# Patient Record
Sex: Female | Born: 1960 | Hispanic: No | Marital: Married | Smoking: Former smoker
Health system: Southern US, Community
[De-identification: ages and names within clinical notes are randomized; demographics above are authoritative.]

## PROBLEM LIST (undated history)

## (undated) DIAGNOSIS — Z973 Presence of spectacles and contact lenses: Secondary | ICD-10-CM

## (undated) DIAGNOSIS — F32A Depression, unspecified: Secondary | ICD-10-CM

## (undated) DIAGNOSIS — H9190 Unspecified hearing loss, unspecified ear: Secondary | ICD-10-CM

## (undated) DIAGNOSIS — E0500 Thyrotoxicosis with diffuse goiter without thyrotoxic crisis or storm: Secondary | ICD-10-CM

## (undated) DIAGNOSIS — F329 Major depressive disorder, single episode, unspecified: Secondary | ICD-10-CM

## (undated) HISTORY — PX: DIAGNOSTIC LAPAROSCOPY: SUR761

## (undated) HISTORY — PX: STAPEDES SURGERY: SHX789

## (undated) HISTORY — PX: CERVICAL FUSION: SHX112

## (undated) HISTORY — PX: COLONOSCOPY: SHX174

---

## 2000-03-12 ENCOUNTER — Other Ambulatory Visit: Admission: RE | Admit: 2000-03-12 | Discharge: 2000-03-12 | Payer: Self-pay | Admitting: Otolaryngology

## 2004-12-30 DIAGNOSIS — E0500 Thyrotoxicosis with diffuse goiter without thyrotoxic crisis or storm: Secondary | ICD-10-CM

## 2004-12-30 HISTORY — DX: Thyrotoxicosis with diffuse goiter without thyrotoxic crisis or storm: E05.00

## 2014-12-01 ENCOUNTER — Other Ambulatory Visit: Payer: Self-pay | Admitting: Orthopedic Surgery

## 2015-01-06 ENCOUNTER — Encounter (HOSPITAL_BASED_OUTPATIENT_CLINIC_OR_DEPARTMENT_OTHER): Payer: Self-pay | Admitting: *Deleted

## 2015-01-06 NOTE — Progress Notes (Signed)
No labs needed

## 2015-01-12 ENCOUNTER — Ambulatory Visit (HOSPITAL_BASED_OUTPATIENT_CLINIC_OR_DEPARTMENT_OTHER): Payer: 59 | Admitting: Certified Registered"

## 2015-01-12 ENCOUNTER — Ambulatory Visit (HOSPITAL_BASED_OUTPATIENT_CLINIC_OR_DEPARTMENT_OTHER)
Admission: RE | Admit: 2015-01-12 | Discharge: 2015-01-12 | Disposition: A | Discharge status: Home / Self Care | Payer: 59 | Source: Ambulatory Visit | Attending: Orthopedic Surgery | Admitting: Orthopedic Surgery

## 2015-01-12 ENCOUNTER — Encounter (HOSPITAL_BASED_OUTPATIENT_CLINIC_OR_DEPARTMENT_OTHER)
Admission: RE | Disposition: A | Discharge status: Home / Self Care | Payer: Self-pay | Source: Ambulatory Visit | Attending: Orthopedic Surgery

## 2015-01-12 ENCOUNTER — Encounter (HOSPITAL_BASED_OUTPATIENT_CLINIC_OR_DEPARTMENT_OTHER): Payer: Self-pay | Admitting: Certified Registered"

## 2015-01-12 DIAGNOSIS — F329 Major depressive disorder, single episode, unspecified: Secondary | ICD-10-CM | POA: Insufficient documentation

## 2015-01-12 DIAGNOSIS — G5602 Carpal tunnel syndrome, left upper limb: Secondary | ICD-10-CM | POA: Insufficient documentation

## 2015-01-12 DIAGNOSIS — M67432 Ganglion, left wrist: Secondary | ICD-10-CM | POA: Insufficient documentation

## 2015-01-12 DIAGNOSIS — Z87891 Personal history of nicotine dependence: Secondary | ICD-10-CM | POA: Diagnosis not present

## 2015-01-12 HISTORY — DX: Depression, unspecified: F32.A

## 2015-01-12 HISTORY — DX: Unspecified hearing loss, unspecified ear: H91.90

## 2015-01-12 HISTORY — PX: GANGLION CYST EXCISION: SHX1691

## 2015-01-12 HISTORY — DX: Major depressive disorder, single episode, unspecified: F32.9

## 2015-01-12 HISTORY — DX: Presence of spectacles and contact lenses: Z97.3

## 2015-01-12 HISTORY — PX: CARPAL TUNNEL RELEASE: SHX101

## 2015-01-12 LAB — POCT HEMOGLOBIN-HEMACUE: Hemoglobin: 14 g/dL (ref 12.0–15.0)

## 2015-01-12 SURGERY — CARPAL TUNNEL RELEASE
Anesthesia: General | Site: Wrist | Laterality: Left

## 2015-01-12 MED ORDER — FENTANYL CITRATE 0.05 MG/ML IJ SOLN: 50.0000 ug | INTRAMUSCULAR | Status: DC | PRN

## 2015-01-12 MED ORDER — FENTANYL CITRATE 0.05 MG/ML IJ SOLN
INTRAMUSCULAR | Status: AC
  Filled 2015-01-12: qty 6

## 2015-01-12 MED ORDER — OXYCODONE-ACETAMINOPHEN 5-325 MG PO TABS: ORAL_TABLET | ORAL | Status: DC

## 2015-01-12 MED ORDER — LIDOCAINE HCL (PF) 1 % IJ SOLN
INTRAMUSCULAR | Status: AC
  Filled 2015-01-12: qty 90

## 2015-01-12 MED ORDER — MIDAZOLAM HCL 2 MG/2ML IJ SOLN
INTRAMUSCULAR | Status: AC
  Filled 2015-01-12: qty 2

## 2015-01-12 MED ORDER — LACTATED RINGERS IV SOLN
INTRAVENOUS | Status: DC
  Administered 2015-01-12: 08:00:00 via INTRAVENOUS

## 2015-01-12 MED ORDER — MIDAZOLAM HCL 5 MG/5ML IJ SOLN
INTRAMUSCULAR | Status: DC | PRN
  Administered 2015-01-12: 2 mg via INTRAVENOUS

## 2015-01-12 MED ORDER — CEFAZOLIN SODIUM-DEXTROSE 2-3 GM-% IV SOLR
INTRAVENOUS | Status: AC
  Filled 2015-01-12: qty 50

## 2015-01-12 MED ORDER — MIDAZOLAM HCL 2 MG/2ML IJ SOLN: 1.0000 mg | INTRAMUSCULAR | Status: DC | PRN

## 2015-01-12 MED ORDER — OXYCODONE HCL 5 MG/5ML PO SOLN: 5.0000 mg | Freq: Once | ORAL | Status: DC | PRN

## 2015-01-12 MED ORDER — OXYCODONE HCL 5 MG PO TABS: 5.0000 mg | ORAL_TABLET | Freq: Once | ORAL | Status: DC | PRN

## 2015-01-12 MED ORDER — LIDOCAINE HCL (CARDIAC) 20 MG/ML IV SOLN
INTRAVENOUS | Status: DC | PRN
  Administered 2015-01-12: 80 mg via INTRAVENOUS

## 2015-01-12 MED ORDER — HYDROMORPHONE HCL 1 MG/ML IJ SOLN
INTRAMUSCULAR | Status: AC
  Filled 2015-01-12: qty 1

## 2015-01-12 MED ORDER — CHLORHEXIDINE GLUCONATE 4 % EX LIQD: 60.0000 mL | Freq: Once | CUTANEOUS | Status: DC

## 2015-01-12 MED ORDER — FENTANYL CITRATE 0.05 MG/ML IJ SOLN
INTRAMUSCULAR | Status: DC | PRN
  Administered 2015-01-12: 100 ug via INTRAVENOUS
  Administered 2015-01-12: 50 ug via INTRAVENOUS

## 2015-01-12 MED ORDER — ONDANSETRON HCL 4 MG/2ML IJ SOLN: 4.0000 mg | Freq: Once | INTRAMUSCULAR | Status: DC | PRN

## 2015-01-12 MED ORDER — DEXAMETHASONE SODIUM PHOSPHATE 10 MG/ML IJ SOLN
INTRAMUSCULAR | Status: DC | PRN
  Administered 2015-01-12: 10 mg via INTRAVENOUS

## 2015-01-12 MED ORDER — CEFAZOLIN SODIUM-DEXTROSE 2-3 GM-% IV SOLR
2.0000 g | INTRAVENOUS | Status: AC
  Administered 2015-01-12: 2 g via INTRAVENOUS

## 2015-01-12 MED ORDER — PROPOFOL 10 MG/ML IV BOLUS
INTRAVENOUS | Status: DC | PRN
  Administered 2015-01-12: 200 mg via INTRAVENOUS

## 2015-01-12 MED ORDER — ONDANSETRON HCL 4 MG/2ML IJ SOLN
INTRAMUSCULAR | Status: DC | PRN
  Administered 2015-01-12: 4 mg via INTRAVENOUS

## 2015-01-12 MED ORDER — 0.9 % SODIUM CHLORIDE (POUR BTL) OPTIME
TOPICAL | Status: DC | PRN
  Administered 2015-01-12: 100 mL

## 2015-01-12 MED ORDER — BUPIVACAINE HCL (PF) 0.25 % IJ SOLN
INTRAMUSCULAR | Status: AC
  Filled 2015-01-12: qty 150

## 2015-01-12 MED ORDER — BUPIVACAINE HCL (PF) 0.25 % IJ SOLN
INTRAMUSCULAR | Status: DC | PRN
  Administered 2015-01-12: 10 mL

## 2015-01-12 MED ORDER — HYDROMORPHONE HCL 1 MG/ML IJ SOLN
0.2500 mg | INTRAMUSCULAR | Status: DC | PRN
  Administered 2015-01-12 (×4): 0.5 mg via INTRAVENOUS

## 2015-01-12 SURGICAL SUPPLY — 48 items
APL SKNCLS STERI-STRIP NONHPOA (GAUZE/BANDAGES/DRESSINGS)
BANDAGE ELASTIC 3 VELCRO ST LF (GAUZE/BANDAGES/DRESSINGS) ×3 IMPLANT
BENZOIN TINCTURE PRP APPL 2/3 (GAUZE/BANDAGES/DRESSINGS) IMPLANT
BLADE MINI RND TIP GREEN BEAV (BLADE) IMPLANT
BLADE SURG 15 STRL LF DISP TIS (BLADE) ×2 IMPLANT
BLADE SURG 15 STRL SS (BLADE) ×6
BNDG CMPR 9X4 STRL LF SNTH (GAUZE/BANDAGES/DRESSINGS) ×1
BNDG CMPR MD 5X2 ELC HKLP STRL (GAUZE/BANDAGES/DRESSINGS)
BNDG ELASTIC 2 VLCR STRL LF (GAUZE/BANDAGES/DRESSINGS) IMPLANT
BNDG ESMARK 4X9 LF (GAUZE/BANDAGES/DRESSINGS) ×2 IMPLANT
BNDG GAUZE ELAST 4 BULKY (GAUZE/BANDAGES/DRESSINGS) ×3 IMPLANT
CHLORAPREP W/TINT 26ML (MISCELLANEOUS) ×3 IMPLANT
CLOSURE WOUND 1/2 X4 (GAUZE/BANDAGES/DRESSINGS)
CORDS BIPOLAR (ELECTRODE) ×3 IMPLANT
COVER BACK TABLE 60X90IN (DRAPES) ×3 IMPLANT
COVER MAYO STAND STRL (DRAPES) ×3 IMPLANT
CUFF TOURNIQUET SINGLE 18IN (TOURNIQUET CUFF) ×3 IMPLANT
DRAPE EXTREMITY T 121X128X90 (DRAPE) ×3 IMPLANT
DRAPE SURG 17X23 STRL (DRAPES) ×3 IMPLANT
DRSG PAD ABDOMINAL 8X10 ST (GAUZE/BANDAGES/DRESSINGS) ×3 IMPLANT
GAUZE SPONGE 4X4 12PLY STRL (GAUZE/BANDAGES/DRESSINGS) ×3 IMPLANT
GAUZE XEROFORM 1X8 LF (GAUZE/BANDAGES/DRESSINGS) ×3 IMPLANT
GLOVE BIO SURGEON STRL SZ7.5 (GLOVE) ×3 IMPLANT
GLOVE BIOGEL PI IND STRL 8 (GLOVE) ×1 IMPLANT
GLOVE BIOGEL PI INDICATOR 8 (GLOVE) ×2
GOWN STRL REUS W/ TWL LRG LVL3 (GOWN DISPOSABLE) ×1 IMPLANT
GOWN STRL REUS W/TWL LRG LVL3 (GOWN DISPOSABLE) ×3
GOWN STRL REUS W/TWL XL LVL3 (GOWN DISPOSABLE) ×3 IMPLANT
NDL HYPO 25X1 1.5 SAFETY (NEEDLE) IMPLANT
NEEDLE HYPO 25X1 1.5 SAFETY (NEEDLE) ×3 IMPLANT
NS IRRIG 1000ML POUR BTL (IV SOLUTION) ×3 IMPLANT
PACK BASIN DAY SURGERY FS (CUSTOM PROCEDURE TRAY) ×3 IMPLANT
PAD CAST 3X4 CTTN HI CHSV (CAST SUPPLIES) IMPLANT
PADDING CAST ABS 4INX4YD NS (CAST SUPPLIES) ×2
PADDING CAST ABS COTTON 4X4 ST (CAST SUPPLIES) ×1 IMPLANT
PADDING CAST COTTON 3X4 STRL (CAST SUPPLIES)
SPLINT PLASTER CAST XFAST 3X15 (CAST SUPPLIES) IMPLANT
SPLINT PLASTER XTRA FASTSET 3X (CAST SUPPLIES)
STOCKINETTE 4X48 STRL (DRAPES) ×3 IMPLANT
STRIP CLOSURE SKIN 1/2X4 (GAUZE/BANDAGES/DRESSINGS) IMPLANT
SUT ETHILON 4 0 PS 2 18 (SUTURE) ×3 IMPLANT
SUT MNCRL AB 4-0 PS2 18 (SUTURE) IMPLANT
SUT MON AB 5-0 PS2 18 (SUTURE) IMPLANT
SUT VIC AB 4-0 P2 18 (SUTURE) ×2 IMPLANT
SYR BULB 3OZ (MISCELLANEOUS) ×3 IMPLANT
SYR CONTROL 10ML LL (SYRINGE) ×2 IMPLANT
TOWEL OR 17X24 6PK STRL BLUE (TOWEL DISPOSABLE) ×4 IMPLANT
UNDERPAD 30X30 INCONTINENT (UNDERPADS AND DIAPERS) ×1 IMPLANT

## 2015-01-12 NOTE — Anesthesia Postprocedure Evaluation (Signed)
  Anesthesia Post-op Note  Patient: [name]  Procedure(s) Performed: Procedure(s): LEFT CARPAL TUNNEL RELEASE AND  (Left) VOLAR GANGLION EXCISION (Left)  Patient Location: PACU  Anesthesia Type: General   Level of Consciousness: awake, alert  and oriented  Airway and Oxygen Therapy: Patient Spontanous Breathing  Post-op Pain: mild  Post-op Assessment: Post-op Vital signs reviewed  Post-op Vital Signs: Reviewed  Last Vitals:  Filed Vitals:   01/12/15 1105  BP: 99/45  Pulse: 72  Temp: 36.4 C  Resp: 16    Complications: No apparent anesthesia complications

## 2015-01-12 NOTE — Transfer of Care (Signed)
Immediate Anesthesia Transfer of Care Note  Patient: Denise Glenn  Procedure(s) Performed: Procedure(s): LEFT CARPAL TUNNEL RELEASE AND  (Left) VOLAR GANGLION EXCISION (Left)  Patient Location: PACU  Anesthesia Type:General  Level of Consciousness: awake, sedated and responds to stimulation  Airway & Oxygen Therapy: Patient Spontanous Breathing and Patient connected to face mask oxygen  Post-op Assessment: Report given to PACU RN, Post -op Vital signs reviewed and stable and Patient moving all extremities  Post vital signs: Reviewed and stable  Complications: No apparent anesthesia complications

## 2015-01-12 NOTE — Op Note (Signed)
NAME:  Denise Glenn, DEMOND           ACCOUNT NO.:  [id number]  MEDICAL RECORD NO.:  [id number]  LOCATION:                                 FACILITY:  PHYSICIAN:  Betha Loa, MD             DATE OF BIRTH:  DATE OF PROCEDURE:  01/12/2015 DATE OF DISCHARGE:                              OPERATIVE REPORT   PREOPERATIVE DIAGNOSIS:  Left carpal tunnel syndrome and volar ganglion cyst.  POSTOPERATIVE DIAGNOSES:  Left carpal tunnel syndrome and volar ganglion cyst.  PROCEDURE:   1. Left carpal tunnel release 2. Excision of left wrist volar ganglion cyst through separate incision.  SURGEON:  Betha Loa, MD  ASSISTANTS:  None.  ANESTHESIA:  General.  IV FLUIDS:  Per anesthesia flow sheet.  ESTIMATED BLOOD LOSS:  Minimal.  COMPLICATIONS:  None.  SPECIMENS:  Left wrist mass to Pathology.  TOURNIQUET TIME:  35 minutes.  DISPOSITION:  Stable to PACU.  INDICATIONS:  Denise Glenn is a 54 year old right-hand-dominant female, who has had a mass on her left wrist for number of months.  This is bothersome to her.  She also has carpal tunnel syndrome.  She has positive nerve conduction studies.  She wished to have the mass excised and carpal tunnel release.  Risks benefits and alternatives of surgery were discussed including the risk of blood loss, infection, damage to nerves, vessels, tendons, ligaments, bone; failure of surgery; need for additional surgery, complications with wound healing, continued pain, recurrence of carpal tunnel, and recurrence of mass as well as damage to motor branch.  She voiced understanding of these risks and elected to proceed.  OPERATIVE COURSE:  After being identified preoperatively by myself, the patient and I agreed upon procedure and site of procedure.  Surgical site was marked.  The risks, benefits, and alternatives of surgery were reviewed and she wished to proceed.  Surgical consent had been signed. She was given IV Ancef as preoperative  antibiotic prophylaxis.  She was transported to the operating  room and placed on the operating room table in supine position with the left upper extremity on arm board. General anesthesia was induced by anesthesiologist.  Left upper extremity was prepped and draped in normal sterile orthopedic fashion. Surgical pause was performed between surgeons, anesthesia, and operating room staff, and all were in agreement as to the patient, procedure, and site of procedure.  Tourniquet at the proximal aspect of the extremity was inflated to 250 mmHg after exsanguination of the limb with an Esmarch bandage.  Incision was made over the transverse carpal ligament and carried into subcutaneous tissues by spreading technique.  Bipolar electrocautery was used to obtain hemostasis in the subcutaneous tissues.  The palmar fascia was sharply incised.  The transverse carpal ligament was identified.  It was incised distally first.  Care was taken to ensure complete decompression distally.  It was then incised proximally.  Scissors were used to split the distal aspect of the Volar antebrachial fascia.  A finger was placed into the wound to ensure complete decompression which was the case.  The nerve was inspected.  It was adherent to the radial leaflet.  The motor branch was identified and was  intact.  The wound was copiously irrigated with sterile saline.  It was closed with 4-0 nylon in a horizontal mattress fashion.  An incision was then made over the mass at the radial side of the wrist.  This was carried into subcutaneous tissues by spreading technique.  Bipolar electrocautery was used to obtain hemostasis in the subcutaneous tissues.  The mass was noted.  It was directly under the radial artery. It was adherent to the under surface of the radial artery.  Great care was taken to free this up.  The mass was freed with soft tissue attachments and the stalk was found coursing down to the joint.  The mass was  removed and sent to Pathology for examination.  A stitch was placed in the rent in the capsule using 4-0 Vicryl suture.  The wound was then copiously irrigated with sterile saline and the skin was closed with 4-0 nylon in a horizontal mattress fashion.  The wounds were all injected with 10 mL of 0.25% plain Marcaine to aid in postoperative analgesia.  The wounds were dressed with sterile Xeroform, 4x4s, and ABD and wrapped with Kerlix and Ace bandage.  Tourniquet was deflated at 35 minutes.  Fingertips were pink with brisk capillary refill after deflation of the tourniquet.  The operative drapes were broken down. The patient was awoken from anesthesia safely.  She was transferred back to stretcher and taken to PACU in stable condition.  I will see her back in the office in 1 week for postoperative followup.  I will give her Percocet 5/325, 1-2 p.o. q.6 hours p.r.n. pain, dispensed #40.     Betha LoaKevin Xylina Rhoads, MD     KK/MEDQ  D:  01/12/2015  T:  01/12/2015  Job:  [id number]

## 2015-01-12 NOTE — Anesthesia Preprocedure Evaluation (Signed)
Anesthesia Evaluation  Patient identified by MRN, date of birth, ID band Patient awake    Reviewed: Allergy & Precautions, NPO status , Patient's Chart, lab work & pertinent test results  Airway Mallampati: I  TM Distance: >3 FB Neck ROM: Full    Dental  (+) Teeth Intact, Dental Advisory Given   Pulmonary former smoker,  breath sounds clear to auscultation        Cardiovascular Rhythm:Regular Rate:Normal     Neuro/Psych    GI/Hepatic   Endo/Other    Renal/GU      Musculoskeletal   Abdominal   Peds  Hematology   Anesthesia Other Findings   Reproductive/Obstetrics                            Anesthesia Physical Anesthesia Plan  ASA: II  Anesthesia Plan: General   Post-op Pain Management:    Induction: Intravenous  Airway Management Planned: LMA  Additional Equipment:   Intra-op Plan:   Post-operative Plan: Extubation in OR  Informed Consent: I have reviewed the patients History and Physical, chart, labs and discussed the procedure including the risks, benefits and alternatives for the proposed anesthesia with the patient or authorized representative who has indicated his/her understanding and acceptance.   Dental advisory given  Plan Discussed with: CRNA, Anesthesiologist and Surgeon  Anesthesia Plan Comments:         Anesthesia Quick Evaluation  

## 2015-01-12 NOTE — Anesthesia Procedure Notes (Signed)
Procedure Name: LMA Insertion Date/Time: 01/12/2015 8:41 AM Performed by: Curly ShoresRAFT, Samika Vetsch W Pre-anesthesia Checklist: Patient identified, Emergency Drugs available, Suction available and Patient being monitored Patient Re-evaluated:Patient Re-evaluated prior to inductionOxygen Delivery Method: Circle System Utilized Preoxygenation: Pre-oxygenation with 100% oxygen Intubation Type: IV induction Ventilation: Mask ventilation without difficulty LMA: LMA inserted LMA Size: 4.0 Number of attempts: 1 Airway Equipment and Method: Bite block Placement Confirmation: positive ETCO2 and breath sounds checked- equal and bilateral Tube secured with: Tape Dental Injury: Teeth and Oropharynx as per pre-operative assessment

## 2015-01-12 NOTE — Op Note (Signed)
[id number] 

## 2015-01-12 NOTE — Brief Op Note (Signed)
01/12/2015  9:29 AM  PATIENT:  Luisa HartVickie Sahota  54 y.o. female  PRE-OPERATIVE DIAGNOSIS:  LEFT CARPAL TUNNEL SYNDROME VOLAR GANGLION CYST  POST-OPERATIVE DIAGNOSIS:  LEFT CARPAL TUNNEL SYNDROME VOLAR GANGLION  PROCEDURE:  Procedure(s): LEFT CARPAL TUNNEL RELEASE AND  (Left) VOLAR GANGLION EXCISION (Left)  SURGEON:  Surgeon(s) and Role:    * Betha LoaKevin Jony Ladnier, MD - Primary  PHYSICIAN ASSISTANT:   ASSISTANTS: none   ANESTHESIA:   general  EBL:  Total I/O In: 700 [I.V.:700] Out: -   BLOOD ADMINISTERED:none  DRAINS: none   LOCAL MEDICATIONS USED:  MARCAINE     SPECIMEN:  Source of Specimen:  left wrist  DISPOSITION OF SPECIMEN:  PATHOLOGY  COUNTS:  YES  TOURNIQUET:   Total Tourniquet Time Documented: Upper Arm (Left) - 35 minutes Total: Upper Arm (Left) - 35 minutes   DICTATION: .Other Dictation: Dictation Number [id number]  PLAN OF CARE: Discharge to home after PACU  PATIENT DISPOSITION:  PACU - hemodynamically stable.

## 2015-01-12 NOTE — Discharge Instructions (Addendum)

## 2015-01-12 NOTE — H&P (Signed)
  Denise HartVickie Sells is an 54 y.o. female.   Chief Complaint: left carpal tunnel and volar ganglion cyst HPI: 54 yo rhd female with cyst at volar left wrist and numbness and tingling in fingertips.  Nocturnal symptoms that wake her three times per week.  Positive nerve conduction studies.  She wishes to have a left carpal tunnel release and volar ganglion excision.    Past Medical History  Diagnosis Date  . Depression   . Wears glasses   . Hearing deficit     Past Surgical History  Procedure Laterality Date  . Stapedes surgery      both ears  . Cervical fusion  [id number]    x2  . Colonoscopy    . Diagnostic laparoscopy      ectopic preg    History reviewed. No pertinent family history. Social History:  reports that she quit smoking about 6 years ago. She does not have any smokeless tobacco history on file. She reports that she does not drink alcohol or use illicit drugs.  Allergies:  Allergies  Allergen Reactions  . Sulfa Antibiotics Rash    Medications Prior to Admission  Medication Sig Dispense Refill  . buPROPion (WELLBUTRIN XL) 300 MG 24 hr tablet Take 300 mg by mouth daily.    . cholecalciferol (VITAMIN D) 1000 UNITS tablet Take 1,000 Units by mouth daily.    . fexofenadine (ALLEGRA) 180 MG tablet Take 180 mg by mouth daily.    Marland Kitchen. pyridoxine (B-6) 100 MG tablet Take 100 mg by mouth daily.    . vitamin A 1610910000 UNIT capsule Take 10,000 Units by mouth daily.    . vitamin C (ASCORBIC ACID) 500 MG tablet Take 500 mg by mouth daily.      Results for orders placed or performed during the hospital encounter of 01/12/15 (from the past 48 hour(s))  Hemoglobin-hemacue, POC     Status: None   Collection Time: 01/12/15  7:57 AM  Result Value Ref Range   Hemoglobin 14.0 12.0 - 15.0 g/dL    No results found.   A comprehensive review of systems was negative except for: Eyes: positive for contacts/glasses  Blood pressure 111/58, pulse 77, temperature 97.9 F (36.6 C),  temperature source Oral, resp. rate 16, height 5\' 6"  (1.676 m), weight 65.942 kg (145 lb 6 oz), SpO2 100 %.  General appearance: alert, cooperative and appears stated age Head: Normocephalic, without obvious abnormality, atraumatic Neck: supple, symmetrical, trachea midline Resp: clear to auscultation bilaterally Cardio: regular rate and rhythm GI: non tender Extremities: intact sensation and capillary refill all digits.  +epl/fpl/io.  no wounds. Pulses: 2+ and symmetric Skin: Skin color, texture, turgor normal. No rashes or lesions Neurologic: Grossly normal Incision/Wound: none  Assessment/Plan Left carpal tunnel syndrome and volar ganglion cyst.  Non operative and operative treatment options were discussed with the patient and patient wishes to proceed with operative treatment. Risks, benefits, and alternatives of surgery were discussed and the patient agrees with the plan of care.   Layla Gramm R 01/12/2015, 8:27 AM

## 2015-01-13 ENCOUNTER — Encounter (HOSPITAL_BASED_OUTPATIENT_CLINIC_OR_DEPARTMENT_OTHER): Payer: Self-pay | Admitting: Orthopedic Surgery

## 2015-01-31 ENCOUNTER — Other Ambulatory Visit: Payer: Self-pay | Admitting: Orthopedic Surgery

## 2015-02-22 ENCOUNTER — Encounter (HOSPITAL_BASED_OUTPATIENT_CLINIC_OR_DEPARTMENT_OTHER): Payer: Self-pay | Admitting: *Deleted

## 2015-02-23 ENCOUNTER — Ambulatory Visit (HOSPITAL_BASED_OUTPATIENT_CLINIC_OR_DEPARTMENT_OTHER): Payer: 59 | Admitting: Anesthesiology

## 2015-02-23 ENCOUNTER — Ambulatory Visit (HOSPITAL_BASED_OUTPATIENT_CLINIC_OR_DEPARTMENT_OTHER)
Admission: RE | Admit: 2015-02-23 | Discharge: 2015-02-23 | Disposition: A | Discharge status: Home / Self Care | Payer: 59 | Source: Ambulatory Visit | Attending: Orthopedic Surgery | Admitting: Orthopedic Surgery

## 2015-02-23 ENCOUNTER — Encounter (HOSPITAL_BASED_OUTPATIENT_CLINIC_OR_DEPARTMENT_OTHER): Payer: Self-pay | Admitting: *Deleted

## 2015-02-23 ENCOUNTER — Encounter (HOSPITAL_BASED_OUTPATIENT_CLINIC_OR_DEPARTMENT_OTHER)
Admission: RE | Disposition: A | Discharge status: Home / Self Care | Payer: Self-pay | Source: Ambulatory Visit | Attending: Orthopedic Surgery

## 2015-02-23 DIAGNOSIS — F329 Major depressive disorder, single episode, unspecified: Secondary | ICD-10-CM | POA: Insufficient documentation

## 2015-02-23 DIAGNOSIS — H9190 Unspecified hearing loss, unspecified ear: Secondary | ICD-10-CM | POA: Insufficient documentation

## 2015-02-23 DIAGNOSIS — Z79899 Other long term (current) drug therapy: Secondary | ICD-10-CM | POA: Insufficient documentation

## 2015-02-23 DIAGNOSIS — G5601 Carpal tunnel syndrome, right upper limb: Secondary | ICD-10-CM | POA: Insufficient documentation

## 2015-02-23 DIAGNOSIS — Z87891 Personal history of nicotine dependence: Secondary | ICD-10-CM | POA: Diagnosis not present

## 2015-02-23 DIAGNOSIS — R200 Anesthesia of skin: Secondary | ICD-10-CM | POA: Diagnosis present

## 2015-02-23 HISTORY — PX: CARPAL TUNNEL RELEASE: SHX101

## 2015-02-23 SURGERY — CARPAL TUNNEL RELEASE
Anesthesia: General | Site: Wrist | Laterality: Left

## 2015-02-23 MED ORDER — FENTANYL CITRATE 0.05 MG/ML IJ SOLN: 50.0000 ug | INTRAMUSCULAR | Status: DC | PRN

## 2015-02-23 MED ORDER — CEFAZOLIN SODIUM-DEXTROSE 2-3 GM-% IV SOLR
INTRAVENOUS | Status: AC
  Filled 2015-02-23: qty 50

## 2015-02-23 MED ORDER — FENTANYL CITRATE 0.05 MG/ML IJ SOLN
INTRAMUSCULAR | Status: AC
  Filled 2015-02-23: qty 4

## 2015-02-23 MED ORDER — PROMETHAZINE HCL 25 MG/ML IJ SOLN: 6.2500 mg | INTRAMUSCULAR | Status: DC | PRN

## 2015-02-23 MED ORDER — BUPIVACAINE HCL (PF) 0.25 % IJ SOLN
INTRAMUSCULAR | Status: AC
  Filled 2015-02-23: qty 120

## 2015-02-23 MED ORDER — CHLORHEXIDINE GLUCONATE 4 % EX LIQD: 60.0000 mL | Freq: Once | CUTANEOUS | Status: DC

## 2015-02-23 MED ORDER — LIDOCAINE HCL (CARDIAC) 20 MG/ML IV SOLN
INTRAVENOUS | Status: DC | PRN
  Administered 2015-02-23: 60 mg via INTRAVENOUS

## 2015-02-23 MED ORDER — MIDAZOLAM HCL 2 MG/2ML IJ SOLN
INTRAMUSCULAR | Status: AC
  Filled 2015-02-23: qty 2

## 2015-02-23 MED ORDER — BUPIVACAINE HCL (PF) 0.25 % IJ SOLN
INTRAMUSCULAR | Status: DC | PRN
  Administered 2015-02-23: 10 mL

## 2015-02-23 MED ORDER — CEFAZOLIN SODIUM-DEXTROSE 2-3 GM-% IV SOLR
2.0000 g | INTRAVENOUS | Status: AC
  Administered 2015-02-23: 2 g via INTRAVENOUS

## 2015-02-23 MED ORDER — ONDANSETRON HCL 4 MG/2ML IJ SOLN
INTRAMUSCULAR | Status: DC | PRN
  Administered 2015-02-23: 4 mg via INTRAVENOUS

## 2015-02-23 MED ORDER — OXYCODONE-ACETAMINOPHEN 5-325 MG PO TABS
ORAL_TABLET | ORAL | Status: AC
Start: 2015-02-23 — End: ?

## 2015-02-23 MED ORDER — MIDAZOLAM HCL 2 MG/2ML IJ SOLN: 1.0000 mg | INTRAMUSCULAR | Status: DC | PRN

## 2015-02-23 MED ORDER — PROPOFOL 10 MG/ML IV BOLUS
INTRAVENOUS | Status: DC | PRN
  Administered 2015-02-23: 200 mg via INTRAVENOUS

## 2015-02-23 MED ORDER — FENTANYL CITRATE 0.05 MG/ML IJ SOLN
INTRAMUSCULAR | Status: DC | PRN
  Administered 2015-02-23: 100 ug via INTRAVENOUS

## 2015-02-23 MED ORDER — KETOROLAC TROMETHAMINE 30 MG/ML IJ SOLN
INTRAMUSCULAR | Status: DC | PRN
  Administered 2015-02-23: 30 mg via INTRAVENOUS

## 2015-02-23 MED ORDER — LACTATED RINGERS IV SOLN
INTRAVENOUS | Status: DC
  Administered 2015-02-23: 07:00:00 via INTRAVENOUS

## 2015-02-23 MED ORDER — DEXAMETHASONE SODIUM PHOSPHATE 4 MG/ML IJ SOLN
INTRAMUSCULAR | Status: DC | PRN
  Administered 2015-02-23: 10 mg via INTRAVENOUS

## 2015-02-23 MED ORDER — MIDAZOLAM HCL 5 MG/5ML IJ SOLN
INTRAMUSCULAR | Status: DC | PRN
  Administered 2015-02-23: 2 mg via INTRAVENOUS

## 2015-02-23 MED ORDER — FENTANYL CITRATE 0.05 MG/ML IJ SOLN: 25.0000 ug | INTRAMUSCULAR | Status: DC | PRN

## 2015-02-23 SURGICAL SUPPLY — 42 items
BANDAGE ELASTIC 3 VELCRO ST LF (GAUZE/BANDAGES/DRESSINGS) ×3 IMPLANT
BLADE MINI RND TIP GREEN BEAV (BLADE) IMPLANT
BLADE SURG 15 STRL LF DISP TIS (BLADE) ×2 IMPLANT
BLADE SURG 15 STRL SS (BLADE) ×6
BNDG CMPR 9X4 STRL LF SNTH (GAUZE/BANDAGES/DRESSINGS) ×1
BNDG ESMARK 4X9 LF (GAUZE/BANDAGES/DRESSINGS) ×2 IMPLANT
BNDG GAUZE ELAST 4 BULKY (GAUZE/BANDAGES/DRESSINGS) ×3 IMPLANT
CHLORAPREP W/TINT 26ML (MISCELLANEOUS) ×3 IMPLANT
CORDS BIPOLAR (ELECTRODE) ×3 IMPLANT
COVER BACK TABLE 60X90IN (DRAPES) ×3 IMPLANT
COVER MAYO STAND STRL (DRAPES) ×3 IMPLANT
CUFF TOURNIQUET SINGLE 18IN (TOURNIQUET CUFF) ×3 IMPLANT
DRAPE EXTREMITY T 121X128X90 (DRAPE) ×3 IMPLANT
DRAPE SURG 17X23 STRL (DRAPES) ×3 IMPLANT
DRSG PAD ABDOMINAL 8X10 ST (GAUZE/BANDAGES/DRESSINGS) ×3 IMPLANT
GAUZE SPONGE 4X4 12PLY STRL (GAUZE/BANDAGES/DRESSINGS) ×3 IMPLANT
GAUZE XEROFORM 1X8 LF (GAUZE/BANDAGES/DRESSINGS) ×3 IMPLANT
GLOVE BIO SURGEON STRL SZ7.5 (GLOVE) ×3 IMPLANT
GLOVE BIOGEL PI IND STRL 6.5 (GLOVE) IMPLANT
GLOVE BIOGEL PI IND STRL 7.0 (GLOVE) IMPLANT
GLOVE BIOGEL PI IND STRL 8 (GLOVE) ×1 IMPLANT
GLOVE BIOGEL PI INDICATOR 6.5 (GLOVE) ×2
GLOVE BIOGEL PI INDICATOR 7.0 (GLOVE) ×2
GLOVE BIOGEL PI INDICATOR 8 (GLOVE) ×2
GLOVE ECLIPSE 6.5 STRL STRAW (GLOVE) ×2 IMPLANT
GLOVE EXAM NITRILE LRG STRL (GLOVE) ×2 IMPLANT
GLOVE SURG SS PI 6.5 STRL IVOR (GLOVE) ×2 IMPLANT
GOWN STRL REUS W/ TWL LRG LVL3 (GOWN DISPOSABLE) ×1 IMPLANT
GOWN STRL REUS W/TWL LRG LVL3 (GOWN DISPOSABLE) ×6
GOWN STRL REUS W/TWL XL LVL3 (GOWN DISPOSABLE) ×3 IMPLANT
NDL HYPO 25X1 1.5 SAFETY (NEEDLE) IMPLANT
NEEDLE HYPO 25X1 1.5 SAFETY (NEEDLE) ×3 IMPLANT
NS IRRIG 1000ML POUR BTL (IV SOLUTION) ×3 IMPLANT
PACK BASIN DAY SURGERY FS (CUSTOM PROCEDURE TRAY) ×3 IMPLANT
PADDING CAST ABS 4INX4YD NS (CAST SUPPLIES)
PADDING CAST ABS COTTON 4X4 ST (CAST SUPPLIES) ×1 IMPLANT
STOCKINETTE 4X48 STRL (DRAPES) ×3 IMPLANT
SUT ETHILON 4 0 PS 2 18 (SUTURE) ×3 IMPLANT
SYR BULB 3OZ (MISCELLANEOUS) ×3 IMPLANT
SYR CONTROL 10ML LL (SYRINGE) ×2 IMPLANT
TOWEL OR 17X24 6PK STRL BLUE (TOWEL DISPOSABLE) ×4 IMPLANT
UNDERPAD 30X30 INCONTINENT (UNDERPADS AND DIAPERS) ×1 IMPLANT

## 2015-02-23 NOTE — Anesthesia Procedure Notes (Signed)
Procedure Name: LMA Insertion Performed by: Louisa Favaro W Pre-anesthesia Checklist: Patient identified, Patient being monitored, Emergency Drugs available, Timeout performed and Suction available Patient Re-evaluated:Patient Re-evaluated prior to inductionOxygen Delivery Method: Circle system utilized Preoxygenation: Pre-oxygenation with 100% oxygen Intubation Type: IV induction Ventilation: Mask ventilation without difficulty LMA: LMA inserted LMA Size: 4.0 Tube type: Oral Number of attempts: 1 Placement Confirmation: positive ETCO2 Tube secured with: Tape Dental Injury: Teeth and Oropharynx as per pre-operative assessment      

## 2015-02-23 NOTE — Brief Op Note (Signed)
02/23/2015  9:26 AM  PATIENT:  Verda CuminsVickie A Kielbasa  54 y.o. female  PRE-OPERATIVE DIAGNOSIS:  RIGHT CARPAL TUNNEL SYNDROME  POST-OPERATIVE DIAGNOSIS:  RIGHT CARPAL TUNNEL SYNDROME  PROCEDURE:  Procedure(s): RIGHT CARPAL TUNNEL RELEASE (Left)  SURGEON:  Surgeon(s) and Role:    * Betha LoaKevin Noemi Ishmael, MD - Primary  PHYSICIAN ASSISTANT:   ASSISTANTS: none   ANESTHESIA:   general  EBL:  Total I/O In: 1000 [I.V.:1000] Out: -   BLOOD ADMINISTERED:none  DRAINS: none   LOCAL MEDICATIONS USED:  MARCAINE     SPECIMEN:  No Specimen  DISPOSITION OF SPECIMEN:  N/A  COUNTS:  YES  TOURNIQUET:   Total Tourniquet Time Documented: Upper Arm (Right) - 13 minutes Total: Upper Arm (Right) - 13 minutes   DICTATION: .Other Dictation: Dictation Number [id number]  PLAN OF CARE: Discharge to home after PACU  PATIENT DISPOSITION:  PACU - hemodynamically stable.

## 2015-02-23 NOTE — Anesthesia Postprocedure Evaluation (Signed)
  Anesthesia Post-op Note  Patient: Denise Glenn  Procedure(s) Performed: Procedure(s) (LRB): RIGHT CARPAL TUNNEL RELEASE (Left)  Patient Location: PACU  Anesthesia Type: General  Level of Consciousness: awake and alert   Airway and Oxygen Therapy: Patient Spontanous Breathing  Post-op Pain: mild  Post-op Assessment: Post-op Vital signs reviewed, Patient's Cardiovascular Status Stable, Respiratory Function Stable, Patent Airway and No signs of Nausea or vomiting  Last Vitals:  Filed Vitals:   02/23/15 0925  BP:   Pulse: 71  Temp: 36.5 C  Resp: 15    Post-op Vital Signs: stable   Complications: No apparent anesthesia complications

## 2015-02-23 NOTE — Op Note (Signed)
[id number] 

## 2015-02-23 NOTE — Transfer of Care (Signed)
Immediate Anesthesia Transfer of Care Note  Patient: Verda CuminsVickie A Ivan  Procedure(s) Performed: Procedure(s): RIGHT CARPAL TUNNEL RELEASE (Left)  Patient Location: PACU  Anesthesia Type:General  Level of Consciousness: awake and sedated  Airway & Oxygen Therapy: Patient Spontanous Breathing and Patient connected to face mask oxygen  Post-op Assessment: Report given to RN and Post -op Vital signs reviewed and stable  Post vital signs: Reviewed and stable  Last Vitals:  Filed Vitals:   02/23/15 0708  BP: 113/75  Pulse: 72  Temp: 36.7 C  Resp: 20    Complications: No apparent anesthesia complications

## 2015-02-23 NOTE — Discharge Instructions (Addendum)

## 2015-02-23 NOTE — H&P (Signed)
  Denise Glenn is an 54 y.o. female.   Chief Complaint: right carpal tunnel syndrome HPI: [age] yo rhd female with numbness and tingling in fingers.  Nocturnal symptoms three times per week that wake her.  Positive nerve conduction studies.  She wishes to have a right carpal tunnel release for management of symptoms.  Past Medical History  Diagnosis Date  . Depression   . Wears glasses   . Hearing deficit     Past Surgical History  Procedure Laterality Date  . Stapedes surgery      both ears  . Cervical fusion  [id number],1997    x2  . Colonoscopy    . Diagnostic laparoscopy      ectopic preg  . Carpal tunnel release Left 01/12/2015    Procedure: LEFT CARPAL TUNNEL RELEASE AND ;  Surgeon: Betha LoaKevin Dayonna Selbe, MD;  Location: [facility];  Service: Orthopedics;  Laterality: Left;  . Ganglion cyst excision Left 01/12/2015    Procedure: VOLAR GANGLION EXCISION;  Surgeon: Betha LoaKevin Ioanna Colquhoun, MD;  Location: [facility];  Service: Orthopedics;  Laterality: Left;    History reviewed. No pertinent family history. Social History:  reports that she quit smoking about 6 years ago. She does not have any smokeless tobacco history on file. She reports that she does not drink alcohol or use illicit drugs.  Allergies:  Allergies  Allergen Reactions  . Sulfa Antibiotics Rash    Medications Prior to Admission  Medication Sig Dispense Refill  . buPROPion (WELLBUTRIN XL) 300 MG 24 hr tablet Take 300 mg by mouth daily.    . cholecalciferol (VITAMIN D) 1000 UNITS tablet Take 1,000 Units by mouth daily.    . fexofenadine (ALLEGRA) 180 MG tablet Take 180 mg by mouth daily.    Marland Kitchen. pyridoxine (B-6) 100 MG tablet Take 100 mg by mouth daily.    . vitamin A 1610910000 UNIT capsule Take 10,000 Units by mouth daily.    . vitamin C (ASCORBIC ACID) 500 MG tablet Take 500 mg by mouth daily.      No results found for this or any previous visit (from the past 48 hour(s)).  No results found.   A  comprehensive review of systems was negative except for: Eyes: positive for contacts/glasses  Blood pressure 113/75, pulse 72, temperature 98 F (36.7 C), temperature source Oral, resp. rate 20, height 5\' 6"  (1.676 m), weight 68.04 kg (150 lb), SpO2 100 %.  General appearance: alert, cooperative and appears stated age Head: Normocephalic, without obvious abnormality, atraumatic Neck: supple, symmetrical, trachea midline Resp: clear to auscultation bilaterally Cardio: regular rate and rhythm GI: non tender Extremities: intact sensation and capillary refill all digits.  +epl/fpl/io.  no wounds. Pulses: 2+ and symmetric Skin: Skin color, texture, turgor normal. No rashes or lesions Neurologic: Grossly normal Incision/Wound: none  Assessment/Plan Right carpal tunnel syndrome.  Non operative and operative treatment options were discussed with the patient and patient wishes to proceed with operative treatment. Risks, benefits, and alternatives of surgery were discussed and the patient agrees with the plan of care.   Laird Runnion R 02/23/2015, 8:46 AM

## 2015-02-23 NOTE — Anesthesia Preprocedure Evaluation (Addendum)
Anesthesia Evaluation  Patient identified by MRN, date of birth, ID band Patient awake    Reviewed: Allergy & Precautions, NPO status , Patient's Chart, lab work & pertinent test results  Airway Mallampati: II  TM Distance: >3 FB Neck ROM: Full    Dental no notable dental hx.    Pulmonary neg pulmonary ROS, former smoker,  breath sounds clear to auscultation  Pulmonary exam normal       Cardiovascular negative cardio ROS  Rhythm:Regular Rate:Normal     Neuro/Psych negative neurological ROS  negative psych ROS   GI/Hepatic negative GI ROS, Neg liver ROS,   Endo/Other  negative endocrine ROS  Renal/GU negative Renal ROS  negative genitourinary   Musculoskeletal negative musculoskeletal ROS (+)   Abdominal   Peds negative pediatric ROS (+)  Hematology negative hematology ROS (+)   Anesthesia Other Findings   Reproductive/Obstetrics negative OB ROS                            Anesthesia Physical Anesthesia Plan  ASA: II  Anesthesia Plan: General   Post-op Pain Management:    Induction: Intravenous  Airway Management Planned: LMA  Additional Equipment:   Intra-op Plan:   Post-operative Plan: Extubation in OR  Informed Consent: I have reviewed the patients History and Physical, chart, labs and discussed the procedure including the risks, benefits and alternatives for the proposed anesthesia with the patient or authorized representative who has indicated his/her understanding and acceptance.   Dental advisory given  Plan Discussed with: CRNA and Surgeon  Anesthesia Plan Comments:         Anesthesia Quick Evaluation

## 2015-02-24 NOTE — Op Note (Signed)
Denise Glenn, Denise Glenn NO.:  [id number]  MEDICAL RECORD NO.:  [id number]  LOCATION:                                 FACILITY:  PHYSICIAN:  Betha Loa, MD             DATE OF BIRTH:  DATE OF PROCEDURE:  02/23/2015 DATE OF DISCHARGE:                              OPERATIVE REPORT   PREOPERATIVE DIAGNOSIS:  Right carpal tunnel syndrome.  POSTOPERATIVE DIAGNOSIS:  Right carpal tunnel syndrome.  PROCEDURE:  Right carpal tunnel release.  SURGEON:  Betha Loa, MD  ASSISTANT:  None.  ANESTHESIA:  General.  IV FLUIDS:  Per anesthesia flow sheet.  ESTIMATED BLOOD LOSS:  Minimal.  COMPLICATIONS:  None.  SPECIMENS:  None.  TOURNIQUET TIME:  13 minutes.  DISPOSITION:  Stable to PACU.  INDICATIONS:  Denise Glenn is a 54 year old female who has had pins and needle sensation in the right hand.  This is bothersome to her.  It wakes her up at night.  She has positive nerve conduction studies.  She wished to have the right carpal tunnel release.  Risks, benefits, and alternatives of surgery were discussed including risk of blood loss, infection, damage to nerves, vessels, tendons, ligaments, bone; failure of surgery; need for additional surgery, complications with wound healing, continued pain, recurrent carpal tunnel syndrome, and damage to motor branch.  She voiced understanding of these risks and elected to proceed.  OPERATIVE COURSE:  After being identified preoperatively by myself, the patient and I agreed upon procedure and site of procedure.  Surgical site was marked.  The risks, benefits, and alternatives of surgery were reviewed and she wished to proceed.  Surgical consent had been signed. She was given IV Ancef as preoperative antibiotic prophylaxis.  She was transported to the operating room and placed on the operating room table in supine position with the right upper extremity on arm board.  General anesthesia was induced by anesthesiologist.   Right upper extremity was prepped and draped in normal sterile orthopedic fashion.  Surgical pause was performed between surgeons, anesthesia, and operating room staff, and all were in agreement as to the patient, procedure, and site of procedure.  Tourniquet at the proximal aspect of the extremity was inflated to 250 mmHg after exsanguination of the limb with an Esmarch bandage.  Incision was made over the transverse carpal ligament and carried into subcutaneous tissues by spreading technique.  Bipolar electrocautery was used to obtain hemostasis.  The palmar fascia was sharply incised.  Transverse carpal ligament was identified and incised sharply.  It was incised distally first.  Care was taken to ensure complete decompression distally.  It was then incised proximally.  The scissors were used to split the distal aspect of the volar antebrachial fascia.  Finger was placed into the wound to ensure complete decompression, which was the case.  The nerve was inspected.  It was adherent to the radial leaflet.  It was slightly flat.  The motor branch was then identified and was intact.  There were some synovium around the tendon.  No masses were noted.  The wound was copiously irrigated with sterile saline and closed with 4-0 nylon in a horizontal  mattress fashion.  It was injected with 10 mL of 0.25% plain Marcaine to aid in postoperative analgesia.  It was then dressed with sterile Xeroform, 4x4s, and ABDOMINAL, and wrapped with Kerlix and Ace bandage. Tourniquet was deflated at 13 minutes.  Fingertips were pink with brisk capillary refill after deflation of tourniquet.  Operative drapes were broken down.  The patient was awoken from anesthesia safely.  She was transferred back to the stretcher and taken to PACU in a stable condition.  I will see her back in the office in 1 week for postoperative followup.  I will give her Percocet 5/325, 1-2 p.o. q.6 hours p.r.n. pain, dispensed  #30.     Betha LoaKevin Nachman Sundt, MD     KK/MEDQ  D:  02/23/2015  T:  02/24/2015  Job:  [id number]

## 2015-02-27 ENCOUNTER — Encounter (HOSPITAL_BASED_OUTPATIENT_CLINIC_OR_DEPARTMENT_OTHER): Payer: Self-pay | Admitting: Orthopedic Surgery

## 2015-05-24 ENCOUNTER — Other Ambulatory Visit: Payer: Self-pay | Admitting: Orthopedic Surgery

## 2015-05-24 DIAGNOSIS — M779 Enthesopathy, unspecified: Secondary | ICD-10-CM

## 2015-06-11 ENCOUNTER — Ambulatory Visit
Admission: RE | Admit: 2015-06-11 | Discharge: 2015-06-11 | Disposition: A | Discharge status: Home / Self Care | Payer: 59 | Source: Ambulatory Visit | Attending: Orthopedic Surgery | Admitting: Orthopedic Surgery

## 2015-06-11 DIAGNOSIS — M779 Enthesopathy, unspecified: Secondary | ICD-10-CM

## 2016-10-24 ENCOUNTER — Other Ambulatory Visit: Payer: Self-pay | Admitting: Orthopedic Surgery

## 2016-12-17 ENCOUNTER — Encounter (HOSPITAL_BASED_OUTPATIENT_CLINIC_OR_DEPARTMENT_OTHER): Payer: Self-pay | Admitting: *Deleted

## 2016-12-19 ENCOUNTER — Encounter (HOSPITAL_BASED_OUTPATIENT_CLINIC_OR_DEPARTMENT_OTHER)
Admission: RE | Disposition: A | Discharge status: Home / Self Care | Payer: Self-pay | Source: Ambulatory Visit | Attending: Orthopedic Surgery

## 2016-12-19 ENCOUNTER — Encounter (HOSPITAL_BASED_OUTPATIENT_CLINIC_OR_DEPARTMENT_OTHER): Payer: Self-pay | Admitting: Certified Registered"

## 2016-12-19 ENCOUNTER — Ambulatory Visit (HOSPITAL_BASED_OUTPATIENT_CLINIC_OR_DEPARTMENT_OTHER): Payer: Managed Care, Other (non HMO) | Admitting: Certified Registered"

## 2016-12-19 ENCOUNTER — Ambulatory Visit (HOSPITAL_BASED_OUTPATIENT_CLINIC_OR_DEPARTMENT_OTHER)
Admission: RE | Admit: 2016-12-19 | Discharge: 2016-12-19 | Disposition: A | Discharge status: Home / Self Care | Payer: Managed Care, Other (non HMO) | Source: Ambulatory Visit | Attending: Orthopedic Surgery | Admitting: Orthopedic Surgery

## 2016-12-19 DIAGNOSIS — F329 Major depressive disorder, single episode, unspecified: Secondary | ICD-10-CM | POA: Insufficient documentation

## 2016-12-19 DIAGNOSIS — Z981 Arthrodesis status: Secondary | ICD-10-CM | POA: Diagnosis not present

## 2016-12-19 DIAGNOSIS — Z79899 Other long term (current) drug therapy: Secondary | ICD-10-CM | POA: Insufficient documentation

## 2016-12-19 DIAGNOSIS — Z9889 Other specified postprocedural states: Secondary | ICD-10-CM | POA: Insufficient documentation

## 2016-12-19 DIAGNOSIS — E0500 Thyrotoxicosis with diffuse goiter without thyrotoxic crisis or storm: Secondary | ICD-10-CM | POA: Insufficient documentation

## 2016-12-19 DIAGNOSIS — Z87891 Personal history of nicotine dependence: Secondary | ICD-10-CM | POA: Insufficient documentation

## 2016-12-19 DIAGNOSIS — Z79891 Long term (current) use of opiate analgesic: Secondary | ICD-10-CM | POA: Diagnosis not present

## 2016-12-19 DIAGNOSIS — Z882 Allergy status to sulfonamides status: Secondary | ICD-10-CM | POA: Insufficient documentation

## 2016-12-19 DIAGNOSIS — M67432 Ganglion, left wrist: Secondary | ICD-10-CM | POA: Diagnosis not present

## 2016-12-19 HISTORY — PX: ARTERY REPAIR: SHX5117

## 2016-12-19 HISTORY — PX: GANGLION CYST EXCISION: SHX1691

## 2016-12-19 HISTORY — DX: Thyrotoxicosis with diffuse goiter without thyrotoxic crisis or storm: E05.00

## 2016-12-19 SURGERY — EXCISION, GANGLION CYST, WRIST
Anesthesia: General | Site: Wrist | Laterality: Left

## 2016-12-19 MED ORDER — LACTATED RINGERS IV SOLN
INTRAVENOUS | Status: DC
  Administered 2016-12-19 (×2): via INTRAVENOUS

## 2016-12-19 MED ORDER — PROPOFOL 10 MG/ML IV BOLUS
INTRAVENOUS | Status: DC | PRN
  Administered 2016-12-19: 200 mg via INTRAVENOUS

## 2016-12-19 MED ORDER — ONDANSETRON HCL 4 MG/2ML IJ SOLN
INTRAMUSCULAR | Status: DC | PRN
  Administered 2016-12-19: 4 mg via INTRAVENOUS

## 2016-12-19 MED ORDER — FENTANYL CITRATE (PF) 100 MCG/2ML IJ SOLN
50.0000 ug | INTRAMUSCULAR | Status: DC | PRN
  Administered 2016-12-19: 100 ug via INTRAVENOUS

## 2016-12-19 MED ORDER — MIDAZOLAM HCL 2 MG/2ML IJ SOLN
1.0000 mg | INTRAMUSCULAR | Status: DC | PRN
  Administered 2016-12-19: 2 mg via INTRAVENOUS

## 2016-12-19 MED ORDER — DEXAMETHASONE SODIUM PHOSPHATE 10 MG/ML IJ SOLN
INTRAMUSCULAR | Status: DC | PRN
  Administered 2016-12-19: 10 mg via INTRAVENOUS

## 2016-12-19 MED ORDER — MEPERIDINE HCL 25 MG/ML IJ SOLN: 6.2500 mg | INTRAMUSCULAR | Status: DC | PRN

## 2016-12-19 MED ORDER — OXYCODONE-ACETAMINOPHEN 5-325 MG PO TABS: ORAL_TABLET | ORAL | 0 refills | Status: AC

## 2016-12-19 MED ORDER — HYDROCODONE-ACETAMINOPHEN 5-325 MG PO TABS
ORAL_TABLET | ORAL | Status: AC
  Filled 2016-12-19: qty 1

## 2016-12-19 MED ORDER — HYDROCODONE-ACETAMINOPHEN 7.5-325 MG PO TABS: 1.0000 | ORAL_TABLET | Freq: Once | ORAL | Status: DC | PRN

## 2016-12-19 MED ORDER — EPHEDRINE SULFATE 50 MG/ML IJ SOLN
INTRAMUSCULAR | Status: DC | PRN
  Administered 2016-12-19 (×2): 10 mg via INTRAVENOUS

## 2016-12-19 MED ORDER — FENTANYL CITRATE (PF) 100 MCG/2ML IJ SOLN
25.0000 ug | INTRAMUSCULAR | Status: DC | PRN
  Administered 2016-12-19 (×3): 50 ug via INTRAVENOUS

## 2016-12-19 MED ORDER — HYDROCODONE-ACETAMINOPHEN 5-325 MG PO TABS
1.0000 | ORAL_TABLET | Freq: Once | ORAL | Status: AC
  Administered 2016-12-19: 1 via ORAL

## 2016-12-19 MED ORDER — LIDOCAINE 2% (20 MG/ML) 5 ML SYRINGE
INTRAMUSCULAR | Status: DC | PRN
  Administered 2016-12-19: 100 mg via INTRAVENOUS

## 2016-12-19 MED ORDER — METOCLOPRAMIDE HCL 5 MG/ML IJ SOLN: 10.0000 mg | Freq: Once | INTRAMUSCULAR | Status: DC | PRN

## 2016-12-19 MED ORDER — LIDOCAINE 2% (20 MG/ML) 5 ML SYRINGE
INTRAMUSCULAR | Status: AC
  Filled 2016-12-19: qty 5

## 2016-12-19 MED ORDER — ONDANSETRON HCL 4 MG/2ML IJ SOLN
INTRAMUSCULAR | Status: AC
  Filled 2016-12-19: qty 2

## 2016-12-19 MED ORDER — CEFAZOLIN SODIUM-DEXTROSE 2-4 GM/100ML-% IV SOLN
2.0000 g | INTRAVENOUS | Status: AC
  Administered 2016-12-19: 2 g via INTRAVENOUS

## 2016-12-19 MED ORDER — FENTANYL CITRATE (PF) 100 MCG/2ML IJ SOLN
INTRAMUSCULAR | Status: AC
  Filled 2016-12-19: qty 2

## 2016-12-19 MED ORDER — CHLORHEXIDINE GLUCONATE 4 % EX LIQD: 60.0000 mL | Freq: Once | CUTANEOUS | Status: DC

## 2016-12-19 MED ORDER — EPHEDRINE 5 MG/ML INJ
INTRAVENOUS | Status: AC
  Filled 2016-12-19: qty 10

## 2016-12-19 MED ORDER — CEFAZOLIN SODIUM-DEXTROSE 2-4 GM/100ML-% IV SOLN
INTRAVENOUS | Status: AC
  Filled 2016-12-19: qty 100

## 2016-12-19 MED ORDER — SCOPOLAMINE 1 MG/3DAYS TD PT72: 1.0000 | MEDICATED_PATCH | Freq: Once | TRANSDERMAL | Status: DC | PRN

## 2016-12-19 MED ORDER — MIDAZOLAM HCL 2 MG/2ML IJ SOLN
INTRAMUSCULAR | Status: AC
  Filled 2016-12-19: qty 2

## 2016-12-19 MED ORDER — DEXAMETHASONE SODIUM PHOSPHATE 10 MG/ML IJ SOLN
INTRAMUSCULAR | Status: AC
  Filled 2016-12-19: qty 1

## 2016-12-19 SURGICAL SUPPLY — 52 items
APL SKNCLS STERI-STRIP NONHPOA (GAUZE/BANDAGES/DRESSINGS)
BANDAGE ACE 3X5.8 VEL STRL LF (GAUZE/BANDAGES/DRESSINGS) ×3 IMPLANT
BENZOIN TINCTURE PRP APPL 2/3 (GAUZE/BANDAGES/DRESSINGS) IMPLANT
BLADE MINI RND TIP GREEN BEAV (BLADE) IMPLANT
BLADE SURG 15 STRL LF DISP TIS (BLADE) ×2 IMPLANT
BLADE SURG 15 STRL SS (BLADE) ×6
BNDG CMPR 9X4 STRL LF SNTH (GAUZE/BANDAGES/DRESSINGS) ×1
BNDG ELASTIC 2X5.8 VLCR STR LF (GAUZE/BANDAGES/DRESSINGS) IMPLANT
BNDG ESMARK 4X9 LF (GAUZE/BANDAGES/DRESSINGS) ×2 IMPLANT
BNDG GAUZE ELAST 4 BULKY (GAUZE/BANDAGES/DRESSINGS) ×3 IMPLANT
CHLORAPREP W/TINT 26ML (MISCELLANEOUS) ×5 IMPLANT
CLOSURE WOUND 1/2 X4 (GAUZE/BANDAGES/DRESSINGS)
CORDS BIPOLAR (ELECTRODE) ×3 IMPLANT
COVER BACK TABLE 60X90IN (DRAPES) ×3 IMPLANT
COVER MAYO STAND STRL (DRAPES) ×3 IMPLANT
CUFF TOURNIQUET SINGLE 18IN (TOURNIQUET CUFF) ×3 IMPLANT
DRAPE EXTREMITY T 121X128X90 (DRAPE) ×3 IMPLANT
DRAPE SURG 17X23 STRL (DRAPES) ×3 IMPLANT
DRSG PAD ABDOMINAL 8X10 ST (GAUZE/BANDAGES/DRESSINGS) IMPLANT
GAUZE SPONGE 4X4 12PLY STRL (GAUZE/BANDAGES/DRESSINGS) ×3 IMPLANT
GAUZE XEROFORM 1X8 LF (GAUZE/BANDAGES/DRESSINGS) ×3 IMPLANT
GLOVE BIO SURGEON STRL SZ 6.5 (GLOVE) ×1 IMPLANT
GLOVE BIO SURGEON STRL SZ7 (GLOVE) ×2 IMPLANT
GLOVE BIO SURGEON STRL SZ7.5 (GLOVE) ×3 IMPLANT
GLOVE BIO SURGEONS STRL SZ 6.5 (GLOVE) ×1
GLOVE BIOGEL PI IND STRL 7.0 (GLOVE) IMPLANT
GLOVE BIOGEL PI IND STRL 8 (GLOVE) ×1 IMPLANT
GLOVE BIOGEL PI INDICATOR 7.0 (GLOVE) ×2
GLOVE BIOGEL PI INDICATOR 8 (GLOVE) ×2
GOWN STRL REUS W/ TWL LRG LVL3 (GOWN DISPOSABLE) ×1 IMPLANT
GOWN STRL REUS W/TWL LRG LVL3 (GOWN DISPOSABLE) ×3
GOWN STRL REUS W/TWL XL LVL3 (GOWN DISPOSABLE) ×5 IMPLANT
NDL HYPO 25X1 1.5 SAFETY (NEEDLE) IMPLANT
NEEDLE HYPO 25X1 1.5 SAFETY (NEEDLE) ×3 IMPLANT
NS IRRIG 1000ML POUR BTL (IV SOLUTION) ×3 IMPLANT
PACK BASIN DAY SURGERY FS (CUSTOM PROCEDURE TRAY) ×3 IMPLANT
PAD CAST 3X4 CTTN HI CHSV (CAST SUPPLIES) IMPLANT
PADDING CAST ABS 4INX4YD NS (CAST SUPPLIES) ×2
PADDING CAST ABS COTTON 4X4 ST (CAST SUPPLIES) ×1 IMPLANT
PADDING CAST COTTON 3X4 STRL (CAST SUPPLIES)
SPLINT PLASTER CAST XFAST 3X15 (CAST SUPPLIES) IMPLANT
SPLINT PLASTER XTRA FASTSET 3X (CAST SUPPLIES)
STOCKINETTE 4X48 STRL (DRAPES) ×3 IMPLANT
STRIP CLOSURE SKIN 1/2X4 (GAUZE/BANDAGES/DRESSINGS) IMPLANT
SUT ETHILON 4 0 PS 2 18 (SUTURE) ×2 IMPLANT
SUT MNCRL AB 4-0 PS2 18 (SUTURE) IMPLANT
SUT MON AB 5-0 PS2 18 (SUTURE) IMPLANT
SUT VIC AB 4-0 P2 18 (SUTURE) ×2 IMPLANT
SYR BULB 3OZ (MISCELLANEOUS) ×3 IMPLANT
SYR CONTROL 10ML LL (SYRINGE) ×2 IMPLANT
TOWEL OR 17X24 6PK STRL BLUE (TOWEL DISPOSABLE) ×6 IMPLANT
UNDERPAD 30X30 (UNDERPADS AND DIAPERS) ×3 IMPLANT

## 2016-12-19 NOTE — Op Note (Signed)
[id number] 

## 2016-12-19 NOTE — Anesthesia Postprocedure Evaluation (Signed)
Anesthesia Post Note  Patient: Denise Glenn  Procedure(s) Performed: Procedure(s) (LRB): REMOVAL GANGLION OF WRIST (Left) RADIAL BRANCH ARTERY REPAIR (Left)  Patient location during evaluation: PACU Anesthesia Type: General Level of consciousness: awake and alert and oriented Pain management: pain level controlled Vital Signs Assessment: post-procedure vital signs reviewed and stable Respiratory status: spontaneous breathing, nonlabored ventilation and respiratory function stable Cardiovascular status: blood pressure returned to baseline and stable Postop Assessment: no signs of nausea or vomiting Anesthetic complications: no       Last Vitals:  Vitals:   12/19/16 1030 12/19/16 1045  BP: (!) 109/92 (!) 77/65  Pulse: 96 86  Resp: (!) 21 19  Temp:      Last Pain:  Vitals:   12/19/16 1056  TempSrc:   PainSc: 7                  Hero Mccathern A.

## 2016-12-19 NOTE — Discharge Instructions (Addendum)

## 2016-12-19 NOTE — Anesthesia Procedure Notes (Signed)
Procedure Name: LMA Insertion Date/Time: 12/19/2016 8:44 AM Performed by: Curly ShoresRAFT, Ennis Heavner W Pre-anesthesia Checklist: Patient identified, Emergency Drugs available, Suction available and Patient being monitored Patient Re-evaluated:Patient Re-evaluated prior to inductionOxygen Delivery Method: Circle system utilized Preoxygenation: Pre-oxygenation with 100% oxygen Intubation Type: IV induction Ventilation: Mask ventilation without difficulty LMA: LMA inserted LMA Size: 3.0 Number of attempts: 1 Airway Equipment and Method: Bite block Placement Confirmation: positive ETCO2 and breath sounds checked- equal and bilateral Tube secured with: Tape Dental Injury: Teeth and Oropharynx as per pre-operative assessment

## 2016-12-19 NOTE — H&P (Signed)
Denise Glenn is an 55 y.o. female.   Chief Complaint: left wrist recurrent ganglion HPI: 55 yo female with recurrence of volar ganglion cyst.  Previous excision approximately 2 years ago.  It has enlarged.  She wishes to have it excised again.  Allergies:  Allergies  Allergen Reactions  . Sulfa Antibiotics Rash    Past Medical History:  Diagnosis Date  . Depression   . Graves disease 2006  . Hearing deficit   . Wears glasses     Past Surgical History:  Procedure Laterality Date  . CARPAL TUNNEL RELEASE Left 01/12/2015   Procedure: LEFT CARPAL TUNNEL RELEASE AND ;  Surgeon: Betha LoaKevin Kamla Skilton, MD;  Location: [facility];  Service: Orthopedics;  Laterality: Left;  . CARPAL TUNNEL RELEASE Left 02/23/2015   Procedure: RIGHT CARPAL TUNNEL RELEASE;  Surgeon: Betha LoaKevin Aracelly Tencza, MD;  Location: [facility];  Service: Orthopedics;  Laterality: Left;  . CERVICAL FUSION  [id number]   x2  . COLONOSCOPY    . DIAGNOSTIC LAPAROSCOPY     ectopic preg  . GANGLION CYST EXCISION Left 01/12/2015   Procedure: VOLAR GANGLION EXCISION;  Surgeon: Betha LoaKevin Lesha Jager, MD;  Location: [facility];  Service: Orthopedics;  Laterality: Left;  . STAPEDES SURGERY     both ears    Family History: History reviewed. No pertinent family history.  Social History:   reports that she quit smoking about 7 years ago. She has quit using smokeless tobacco. She reports that she does not drink alcohol or use drugs.  Medications: Medications Prior to Admission  Medication Sig Dispense Refill  . buPROPion (WELLBUTRIN XL) 300 MG 24 hr tablet Take 300 mg by mouth daily.    . cholecalciferol (VITAMIN D) 1000 UNITS tablet Take 1,000 Units by mouth daily.    Marland Kitchen. oxyCODONE-acetaminophen (PERCOCET) 5-325 MG per tablet 1-2 tabs po q6 hours prn pain 30 tablet 0  . pyridoxine (B-6) 100 MG tablet Take 100 mg by mouth daily.    . vitamin A [id number] UNIT capsule Take 10,000 Units by mouth daily.    . vitamin  C (ASCORBIC ACID) 500 MG tablet Take 500 mg by mouth daily.      No results found for this or any previous visit (from the past 48 hour(s)).  No results found.   A comprehensive review of systems was negative.  Blood pressure 93/76, pulse 74, temperature 98.3 F (36.8 C), temperature source Oral, resp. rate 20, height 5\' 6"  (1.676 m), weight 67.3 kg (148 lb 6 oz), SpO2 100 %.  General appearance: alert, cooperative and appears stated age Head: Normocephalic, without obvious abnormality, atraumatic Neck: supple, symmetrical, trachea midline Resp: clear to auscultation bilaterally Cardio: regular rate and rhythm GI: non-tender Extremities: Intact sensation and capillary refill all digits.  +epl/fpl/io.  No wounds.  Pulses: 2+ and symmetric Skin: Skin color, texture, turgor normal. No rashes or lesions Neurologic: Grossly normal Incision/Wound:none  Assessment/Plan Left wrist recurrent volar ganglion cyst.  Non operative and operative treatment options were discussed with the patient and patient wishes to proceed with operative treatment. Risks, benefits, and alternatives of surgery were discussed and the patient agrees with the plan of care.   Sharai Overbay R 12/19/2016, 8:29 AM

## 2016-12-19 NOTE — Op Note (Signed)
NAMMickeal Needy:  Glenn, Denise            ACCOUNT NO.:  [id number]  MEDICAL RECORD NO.:  [id number]  LOCATION:                                 FACILITY:  PHYSICIAN:  Denise LoaKevin Kendahl Bumgardner, MD             DATE OF BIRTH:  DATE OF PROCEDURE:  12/19/2016 DATE OF DISCHARGE:                              OPERATIVE REPORT   PREOPERATIVE DIAGNOSIS:  Left wrist recurrent ganglion.  POSTOPERATIVE DIAGNOSIS:  Left wrist recurrent ganglion.  PROCEDURE:   1. Excision of left wrist recurrent ganglion 2. Repair of branch of radial artery tear.  SURGEON:  Denise LoaKevin Brinley Treanor, MD.  ASSISTANT:  None.  ANESTHESIA:  General.  IV FLUIDS:  Per anesthesia flow sheet.  ESTIMATED BLOOD LOSS:  Minimal.  COMPLICATIONS:  Branch of radial artery tear.  SPECIMENS:  Ganglion to Pathology.  TOURNIQUET TIME:  21 minutes plus 20 minutes.  DISPOSITION:  Stable to PACU.  PREOP INDICATIONS:  Ms. Denise Glenn is a 55 year old female, who has had a previous excision of a left wrist volar ganglion, this is approximately 2 years ago.  It has recurred.  She wished to have a repeat excision. Risks, benefits, and alternatives of surgery were discussed including risk of blood loss, infection, damage to nerves, vessels, tendons, ligaments, bone for surgery, need for additional surgery, complications with wound healing, continued pain, and recurrence of ganglion.  She voiced understanding of these risks and elected to proceed.  OPERATIVE COURSE:  After being identified preoperatively by myself, the patient and I agreed upon procedure and site of procedure.  Surgical site was marked.  The risks, benefits, and alternatives of surgery were reviewed and she wished to proceed.  Surgical consent had been signed. She was given IV Ancef as preoperative antibiotic prophylaxis.  She was transferred to the operating room and placed on the operating table in supine position with the left upper extremity on arm board.  General anesthesia was induced  by Anesthesiology.  Left upper extremity was prepped and draped in normal sterile orthopedic fashion.  Surgical pause was performed between surgeons, anesthesia, operating staff; and all were in agreement as to the patient, procedure, and site of procedure. Tourniquet at the proximal aspect of the extremity was inflated to 250 mmHg after exsanguination of the limb with an Esmarch bandage.  The previous incision was followed.  It was extended both proximally and distally to aid in visualization.  There was scarring in the surgical field.  The radial artery was identified proximal and distal to the area and carefully traced centrally.  The FCR tendon sheath was released.  It was released to the trapezial ridge.  This was to aid in visualization. The FCR tendon sheath was incised on its deep portion.  The ganglion was identified underneath the radial artery.  It was carefully freed up.  It was excised and sent to Pathology for examination.  The stalk was identified.  A 4-0 Vicryl suture was used in a figure-of-eight fashion in the rent in the capsule to try and prevent recurrence.  The wound was copiously irrigated with sterile saline.  There was noted to be bleeding from the edge of the radial artery.  The tourniquet was deflated and direct pressure held for approximately 5 minutes.  After direct pressure was lifted, there was pulsatile bleeding.  After 15 minutes, the tourniquet was reinflated to 250 mmHg after exsanguination of the limb with an Esmarch bandage.  The microscope was brought in.  The area was examined.  There was a tear of a branch of the radial artery not the main portion of the artery itself.  This branch point was a scarred area.  The tear was cleaned.  The arterial lumen was irrigated.  The tear was repaired with 9-0 nylon suture in an interrupted fashion. Tourniquet was again deflated at 20 minutes.  Fingertips were all pink with brisk capillary refill after deflation of  tourniquet.  There was no pulsatile bleeding.  The wound was again irrigated with sterile saline. It was then closed with 4-0 nylon in a horizontal mattress fashion.  It was dressed with sterile Xeroform, 4x4s, and wrapped with a Kerlix.  A volar splint was placed and wrapped with Kerlix and Ace bandage.  The operative drapes were broken down.  The patient was awoken from anesthesia safely.  She was transferred back to stretcher and taken to PACU in stable condition.  I will see her back in the office in 1 week for postoperative followup.  I will give her Percocet 5/325 one to two p.o. q.6 hours p.r.n. pain dispensed #30.     Denise LoaKevin Jaylynne Birkhead, MD     KK/MEDQ  D:  12/19/2016  T:  12/19/2016  Job:  [id number]

## 2016-12-19 NOTE — Anesthesia Preprocedure Evaluation (Addendum)
Anesthesia Evaluation  Patient identified by MRN, date of birth, ID band Patient awake    Reviewed: Allergy & Precautions, NPO status , Patient's Chart, lab work & pertinent test results  History of Anesthesia Complications (+) AWARENESS UNDER ANESTHESIA  Airway Mallampati: II  TM Distance: >3 FB Neck ROM: Full    Dental no notable dental hx. (+) Teeth Intact, Caps   Pulmonary former smoker,    Pulmonary exam normal breath sounds clear to auscultation       Cardiovascular negative cardio ROS Normal cardiovascular exam Rhythm:Regular Rate:Normal     Neuro/Psych PSYCHIATRIC DISORDERS Depression negative neurological ROS     GI/Hepatic negative GI ROS, Neg liver ROS,   Endo/Other  Hyperthyroidism   Renal/GU negative Renal ROS  negative genitourinary   Musculoskeletal Ganglion cyst left wrist   Abdominal   Peds  Hematology negative hematology ROS (+)   Anesthesia Other Findings   Reproductive/Obstetrics                            Anesthesia Physical Anesthesia Plan  ASA: II  Anesthesia Plan: General   Post-op Pain Management:    Induction: Intravenous  Airway Management Planned: LMA  Additional Equipment:   Intra-op Plan:   Post-operative Plan: Extubation in OR  Informed Consent: I have reviewed the patients History and Physical, chart, labs and discussed the procedure including the risks, benefits and alternatives for the proposed anesthesia with the patient or authorized representative who has indicated his/her understanding and acceptance.   Dental advisory given  Plan Discussed with: Anesthesiologist, CRNA and Surgeon  Anesthesia Plan Comments:         Anesthesia Quick Evaluation

## 2016-12-19 NOTE — Brief Op Note (Signed)
12/19/2016  10:04 AM  PATIENT:  Denise Glenn  55 y.o. female  PRE-OPERATIVE DIAGNOSIS:  LEFT WRIST RECURRENT ULNAR GANGLION  POST-OPERATIVE DIAGNOSIS:  LEFT WRIST RECURRENT ULNAR GANGLION  PROCEDURE:  Procedure(s): REMOVAL GANGLION OF WRIST (Left) RADIAL BRANCH ARTERY REPAIR (Left)  SURGEON:  Surgeon(s) and Role:    * Betha LoaKevin Naliya Gish, MD - Primary  PHYSICIAN ASSISTANT:   ASSISTANTS: none   ANESTHESIA:   general  EBL:  Total I/O In: 1200 [I.V.:1200] Out: 50 [Blood:50]  BLOOD ADMINISTERED:none  DRAINS: none   LOCAL MEDICATIONS USED:  NONE  SPECIMEN:  Source of Specimen:  left wrist  DISPOSITION OF SPECIMEN:  PATHOLOGY  COUNTS:  YES  TOURNIQUET:   Total Tourniquet Time Documented: Upper Arm (Left) - 21 minutes Upper Arm (Left) - 20 minutes Total: Upper Arm (Left) - 41 minutes   DICTATION: .Other Dictation: Dictation Number [id number]  PLAN OF CARE: Discharge to home after PACU  PATIENT DISPOSITION:  PACU - hemodynamically stable.

## 2016-12-19 NOTE — Transfer of Care (Signed)
Immediate Anesthesia Transfer of Care Note  Patient: Verda CuminsVickie A Dambrosio  Procedure(s) Performed: Procedure(s): REMOVAL GANGLION OF WRIST (Left) RADIAL BRANCH ARTERY REPAIR (Left)  Patient Location: PACU  Anesthesia Type:General  Level of Consciousness: awake, alert  and patient cooperative  Airway & Oxygen Therapy: Patient Spontanous Breathing and Patient connected to face mask oxygen  Post-op Assessment: Report given to RN, Post -op Vital signs reviewed and stable and Patient moving all extremities  Post vital signs: Reviewed and stable  Last Vitals:  Vitals:   12/19/16 0809 12/19/16 1010  BP: 93/76   Pulse: 74 97  Resp: 20 19  Temp: 36.8 C (P) 36.4 C    Last Pain:  Vitals:   12/19/16 0809  TempSrc: Oral  PainSc: 2       Patients Stated Pain Goal: 2 (12/19/16 0809)  Complications: No apparent anesthesia complications

## 2017-12-11 ENCOUNTER — Other Ambulatory Visit: Payer: Self-pay | Admitting: Obstetrics and Gynecology

## 2017-12-11 DIAGNOSIS — R928 Other abnormal and inconclusive findings on diagnostic imaging of breast: Secondary | ICD-10-CM

## 2017-12-16 ENCOUNTER — Ambulatory Visit: Payer: 59

## 2017-12-16 ENCOUNTER — Ambulatory Visit
Admission: RE | Admit: 2017-12-16 | Discharge: 2017-12-16 | Disposition: A | Discharge status: Home / Self Care | Payer: 59 | Source: Ambulatory Visit | Attending: Obstetrics and Gynecology | Admitting: Obstetrics and Gynecology

## 2017-12-16 DIAGNOSIS — R928 Other abnormal and inconclusive findings on diagnostic imaging of breast: Secondary | ICD-10-CM

## 2018-08-10 IMAGING — MG 2D DIGITAL DIAGNOSTIC UNILATERAL LEFT MAMMOGRAM WITH CAD AND ADJ
6 series · 6 of 14 positions shown · non-contrast
Comparison: Previous exam(s).

CLINICAL DATA: Patient recalled from screening for possible left
breast distortion.

EXAM:
2D DIGITAL DIAGNOSTIC UNILATERAL LEFT MAMMOGRAM WITH CAD AND ADJUNCT
TOMO

[L MLO synth-2D]
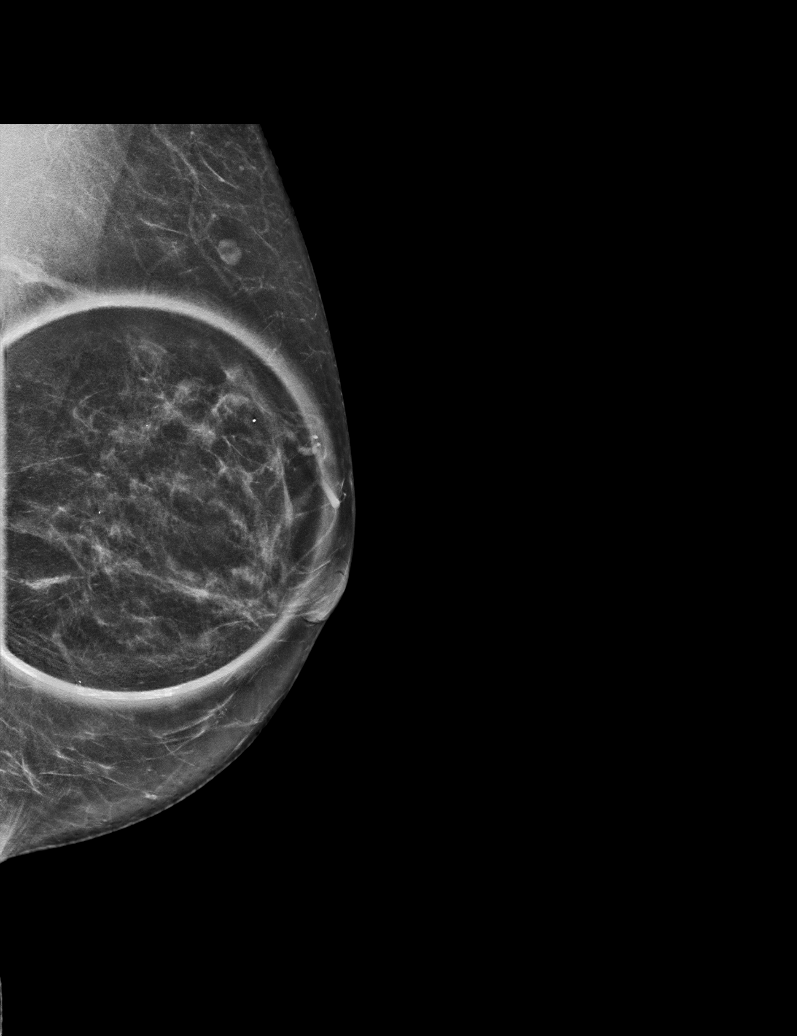

[L CC]
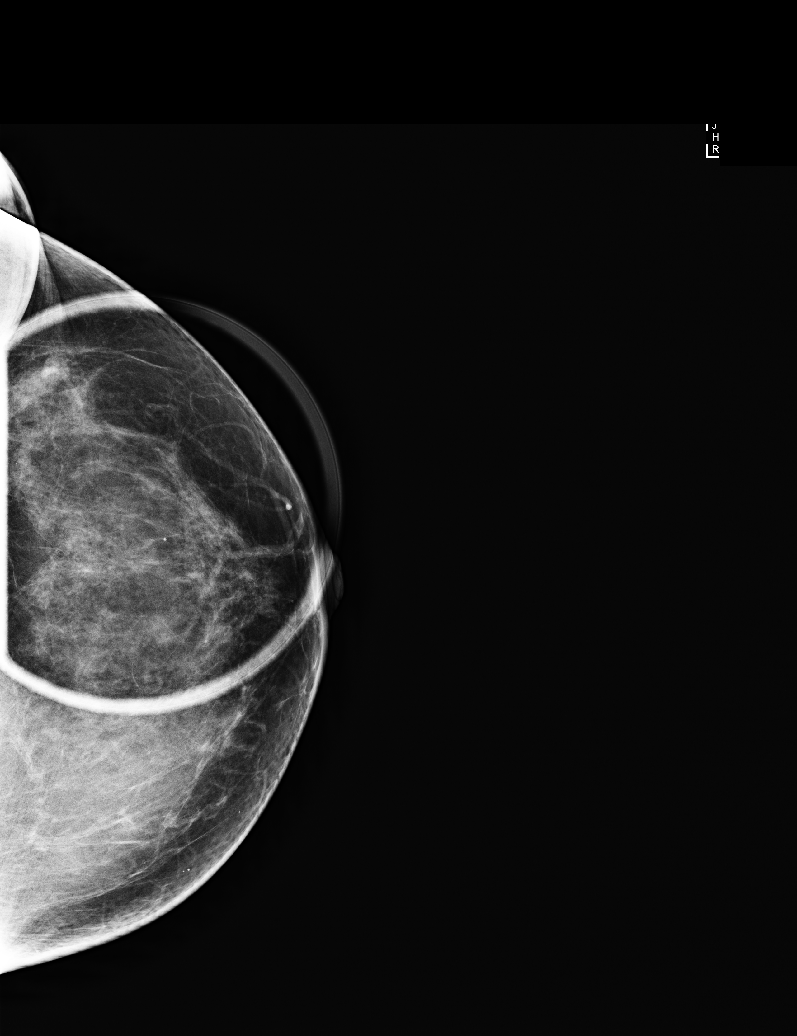

[L MLO]
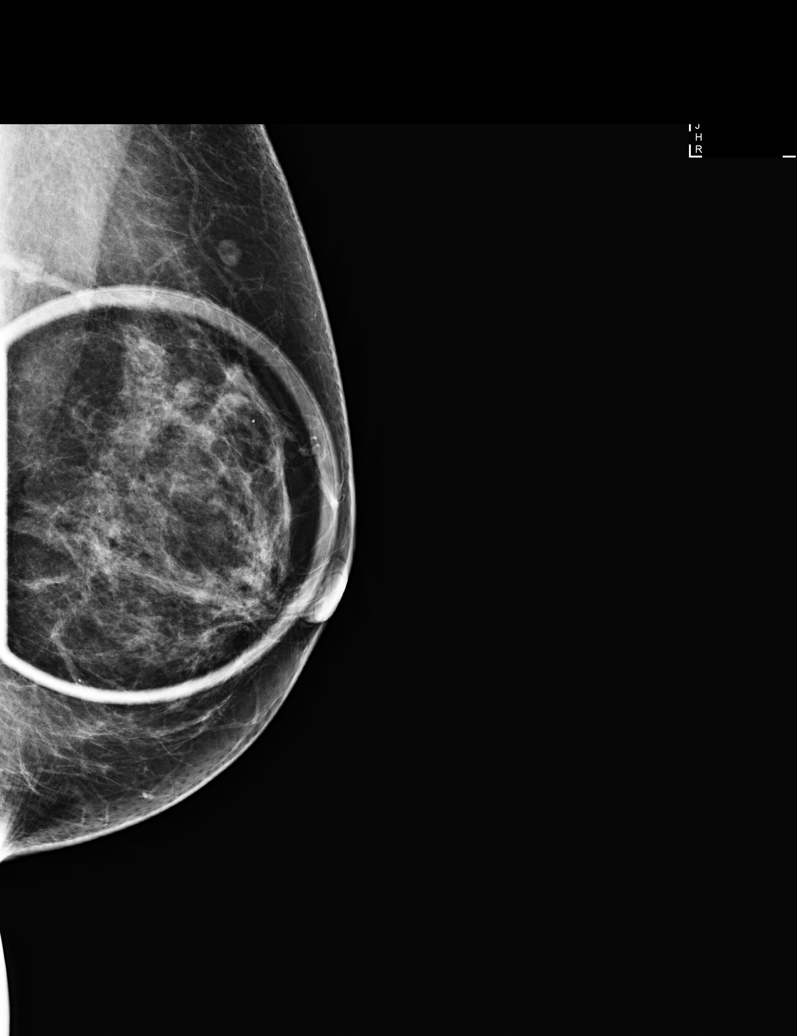

[L CC synth-2D]
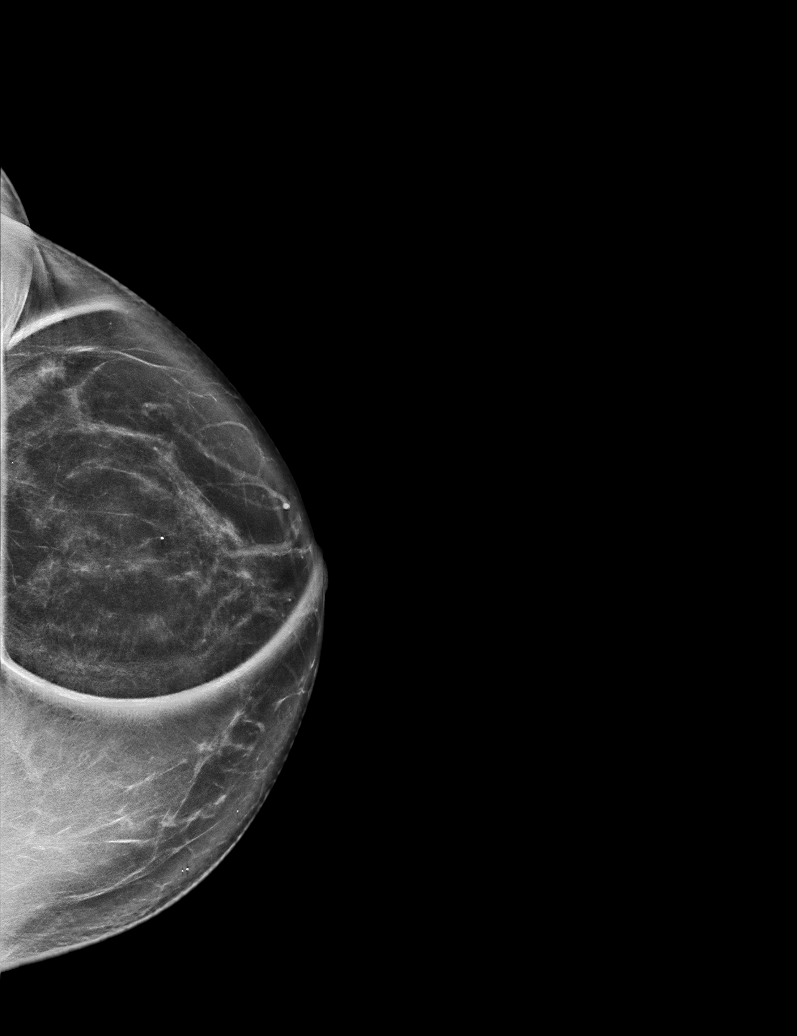

[L CC tomo · tomo slice 37/72.0]
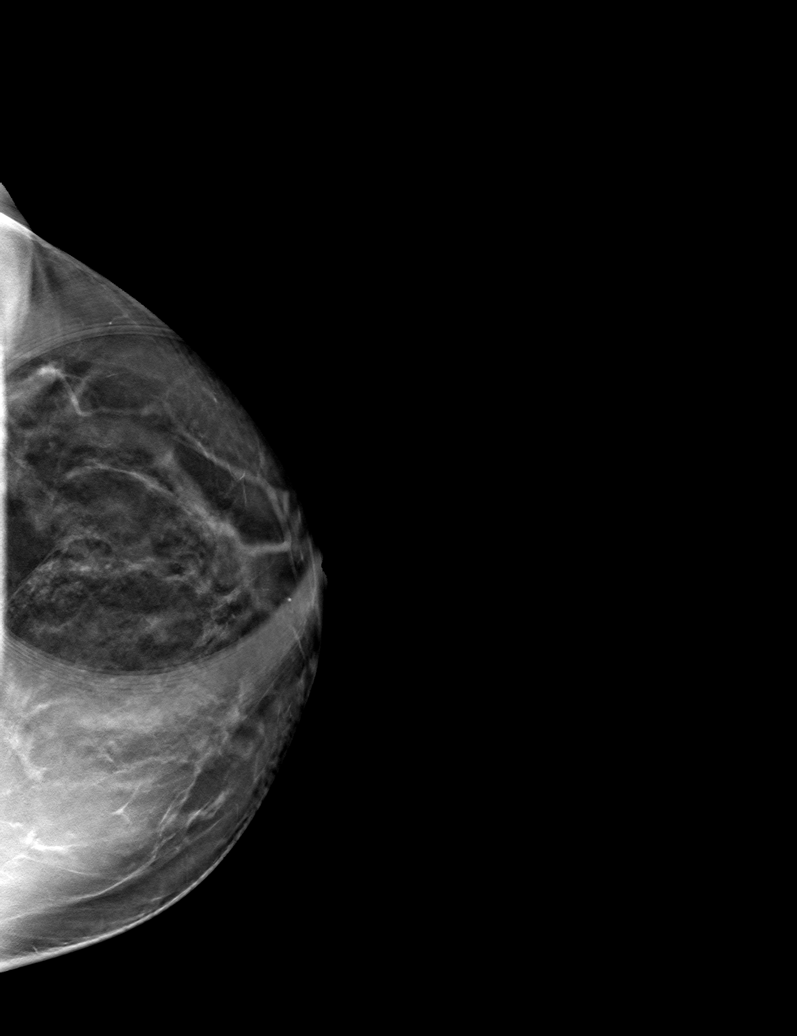

[L MLO tomo · tomo slice 33/65.0]
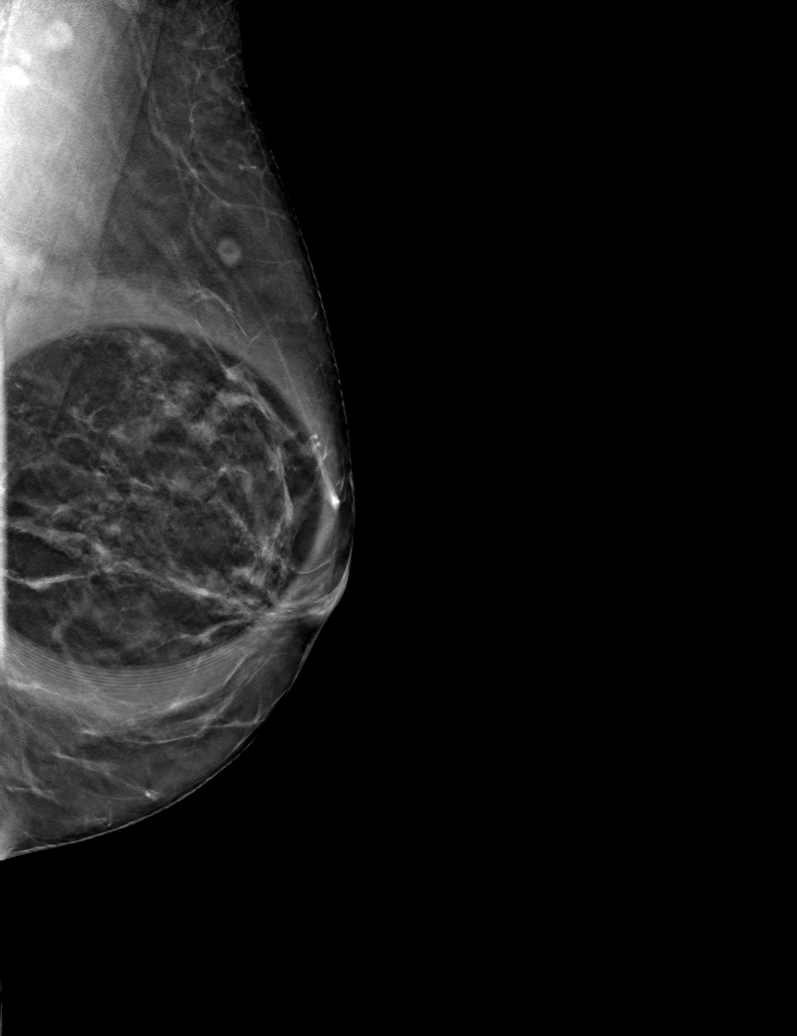

[6 of 14 positions shown; findings below may reference images not displayed]

ACR Breast Density Category c: The breast tissue is heterogeneously
dense, which may obscure small masses.
FINDINGS: Spot compression CC and MLO tomosynthesis images of the left breast
were obtained. Questioned distortion within the lateral posterior
left breast resolved with additional imaging, most compatible with
overlapping tissue.

Mammographic images were processed with CAD.
IMPRESSION: No mammographic evidence for malignancy.

RECOMMENDATION:
Screening mammogram in one year.(Code:[id number])

I have discussed the findings and recommendations with the patient.
Results were also provided in writing at the conclusion of the
visit. If applicable, a reminder letter will be sent to the patient
regarding the next appointment.

BI-RADS CATEGORY  1: Negative.

## 2019-07-21 ENCOUNTER — Other Ambulatory Visit: Payer: Self-pay | Admitting: Critical Care Medicine

## 2019-07-21 DIAGNOSIS — Z20822 Contact with and (suspected) exposure to covid-19: Secondary | ICD-10-CM

## 2023-01-29 ENCOUNTER — Other Ambulatory Visit: Payer: Self-pay | Admitting: Endocrinology

## 2023-01-29 DIAGNOSIS — I700 Atherosclerosis of aorta: Secondary | ICD-10-CM

## 2023-02-27 ENCOUNTER — Ambulatory Visit
Admission: RE | Admit: 2023-02-27 | Discharge: 2023-02-27 | Disposition: A | Discharge status: Home / Self Care | Payer: No Typology Code available for payment source | Source: Ambulatory Visit | Attending: Endocrinology | Admitting: Endocrinology

## 2023-02-27 DIAGNOSIS — I700 Atherosclerosis of aorta: Secondary | ICD-10-CM
